# Patient Record
Sex: Female | Born: 1985 | Race: White | Hispanic: No | Marital: Single | Smoking: Never smoker
Health system: Southern US, Community
[De-identification: ages and names within clinical notes are randomized; demographics above are authoritative.]

---
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
## 2013-05-28 ENCOUNTER — Emergency Department (HOSPITAL_COMMUNITY)
Admission: EM | Admit: 2013-05-28 | Discharge: 2013-05-29 | Disposition: A | Discharge status: Home / Self Care | Payer: BC Managed Care – PPO | Attending: Emergency Medicine | Admitting: Emergency Medicine

## 2013-05-28 ENCOUNTER — Encounter (HOSPITAL_COMMUNITY): Payer: Self-pay | Admitting: Emergency Medicine

## 2013-05-28 ENCOUNTER — Emergency Department (HOSPITAL_COMMUNITY): Payer: BC Managed Care – PPO

## 2013-05-28 DIAGNOSIS — R072 Precordial pain: Secondary | ICD-10-CM | POA: Insufficient documentation

## 2013-05-28 DIAGNOSIS — R0602 Shortness of breath: Secondary | ICD-10-CM | POA: Insufficient documentation

## 2013-05-28 DIAGNOSIS — R0781 Pleurodynia: Secondary | ICD-10-CM

## 2013-05-28 DIAGNOSIS — Z79899 Other long term (current) drug therapy: Secondary | ICD-10-CM | POA: Insufficient documentation

## 2013-05-28 LAB — CBC
HCT: 39.1 % (ref 36.0–46.0)
Hemoglobin: 13.4 g/dL (ref 12.0–15.0)
MCH: 27.3 pg (ref 26.0–34.0)
MCHC: 34.3 g/dL (ref 30.0–36.0)
MCV: 79.8 fL (ref 78.0–100.0)
Platelets: 362 K/uL (ref 150–400)
RBC: 4.9 MIL/uL (ref 3.87–5.11)
RDW: 12.8 % (ref 11.5–15.5)
WBC: 6.3 K/uL (ref 4.0–10.5)

## 2013-05-28 LAB — POCT I-STAT TROPONIN I: Troponin i, poc: 0 ng/mL (ref 0.00–0.08)

## 2013-05-28 LAB — BASIC METABOLIC PANEL WITH GFR
BUN: 8 mg/dL (ref 6–23)
CO2: 24 meq/L (ref 19–32)
Calcium: 9 mg/dL (ref 8.4–10.5)
Chloride: 99 meq/L (ref 96–112)
Creatinine, Ser: 0.73 mg/dL (ref 0.50–1.10)
GFR calc Af Amer: >90 mL/min (ref >90)
GFR calc non Af Amer: >90 mL/min (ref >90)
Glucose, Bld: 127 mg/dL — ABNORMAL HIGH (ref 70–99)
Potassium: 3.7 meq/L (ref 3.5–5.1)
Sodium: 135 meq/L (ref 135–145)

## 2013-05-28 LAB — PRO B NATRIURETIC PEPTIDE: Pro B Natriuretic peptide (BNP): 9.6 pg/mL (ref 0–125)

## 2013-05-28 MED ORDER — SODIUM CHLORIDE 0.9 % IV BOLUS (SEPSIS)
500.0000 mL | Freq: Once | INTRAVENOUS | Status: AC
  Administered 2013-05-29: 500 mL via INTRAVENOUS

## 2013-05-28 MED ORDER — IBUPROFEN 800 MG PO TABS
800.0000 mg | ORAL_TABLET | Freq: Once | ORAL | Status: AC
  Administered 2013-05-29: 800 mg via ORAL
  Filled 2013-05-28: qty 1

## 2013-05-28 NOTE — ED Notes (Signed)
Pt states CP all over that started last night around midnight. Pain exacerbated by deep breaths.

## 2013-05-28 NOTE — ED Provider Notes (Signed)
CSN: [id number]     Arrival date & time 05/28/13  2131 History   First MD Initiated Contact with Patient 05/28/13 2331     Chief Complaint  Patient presents with  . Chest Pain   (Consider location/radiation/quality/duration/timing/severity/associated sxs/prior Treatment) HPI Comments: 27 yo female with DM, obesity hx presents with pleuritic cp today intermittent with coughing/ breathing, no hx of similar. Patient denies blood clot history, active cancer, recent major trauma or surgery, unilateral leg swelling/ pain, recent long travel, hemoptysis or oral contraceptives. No cardiac hx, smoking or exertional sxs.  Improves with not taking big breaths. Non radiating, right anterior, sharp.  Patient is a 27 y.o. female presenting with chest pain. The history is provided by the patient.  Chest Pain Associated symptoms: shortness of breath   Associated symptoms: no abdominal pain, no back pain, no cough, no fever, no headache and not vomiting     History reviewed. No pertinent past medical history. No past surgical history on file. No family history on file. History  Substance Use Topics  . Smoking status: Never Smoker   . Smokeless tobacco: Never Used  . Alcohol Use: No   OB History   Grav Para Term Preterm Abortions TAB SAB Ect Mult Living                 Review of Systems  Constitutional: Negative for fever and chills.  HENT: Negative for congestion.   Eyes: Negative for visual disturbance.  Respiratory: Positive for shortness of breath. Negative for cough.   Cardiovascular: Positive for chest pain.  Gastrointestinal: Negative for vomiting and abdominal pain.  Genitourinary: Negative for dysuria and flank pain.  Musculoskeletal: Negative for back pain, neck pain and neck stiffness.  Skin: Negative for rash.  Neurological: Negative for light-headedness and headaches.    Allergies  Review of patient's allergies indicates no known allergies.  Home Medications   Current  Outpatient Rx  Name  Route  Sig  Dispense  Refill  . buPROPion (WELLBUTRIN XL) 150 MG 24 hr tablet   Oral   Take 150 mg by mouth daily.         Marland Kitchen ibuprofen (ADVIL,MOTRIN) 200 MG tablet   Oral   Take 600 mg by mouth every 6 (six) hours as needed for mild pain.          . metFORMIN (GLUCOPHAGE) 500 MG tablet   Oral   Take 500 mg by mouth daily.         . pseudoephedrine (SUDAFED) 120 MG 12 hr tablet   Oral   Take 120 mg by mouth daily as needed for congestion.          BP 135/79  Pulse 110  Temp(Src) 98.9 F (37.2 C) (Oral)  Resp 16  Wt 280 lb (127.007 kg)  SpO2 96%  LMP 04/12/2013 Physical Exam  Nursing note and vitals reviewed. Constitutional: She is oriented to person, place, and time. She appears well-developed and well-nourished.  HENT:  Head: Normocephalic and atraumatic.  Eyes: Conjunctivae are normal. Right eye exhibits no discharge. Left eye exhibits no discharge.  Neck: Normal range of motion. Neck supple. No tracheal deviation present.  Cardiovascular: Normal rate and regular rhythm.   Pulmonary/Chest: Effort normal and breath sounds normal.  Abdominal: Soft. She exhibits no distension. There is no tenderness. There is no guarding.  Musculoskeletal: She exhibits no edema and no tenderness.  Neurological: She is alert and oriented to person, place, and time.  Skin: Skin is warm.  No rash noted.  Psychiatric: She has a normal mood and affect.    ED Course  Procedures (including critical care time) Labs Review Labs Reviewed  BASIC METABOLIC PANEL - Abnormal; Notable for the following:    Glucose, Bld 127 (*)    All other components within normal limits  D-DIMER, QUANTITATIVE - Abnormal; Notable for the following:    D-Dimer, Quant 3.61 (*)    All other components within normal limits  HEPATIC FUNCTION PANEL - Abnormal; Notable for the following:    Total Protein 8.4 (*)    Total Bilirubin 0.2 (*)    All other components within normal limits  CBC   PRO B NATRIURETIC PEPTIDE  POCT I-STAT TROPONIN I   Imaging Review Dg Chest 2 View  05/28/2013   CLINICAL DATA:  Chest pain and shortness of breath.  EXAM: CHEST  2 VIEW  COMPARISON:  Chest radiograph performed 11/22/2004  FINDINGS: The lungs are well-aerated and clear. There is no evidence of focal opacification, pleural effusion or pneumothorax.  The heart is borderline enlarged. No acute osseous abnormalities are seen.  IMPRESSION: No acute cardiopulmonary process seen; borderline cardiomegaly.   Electronically Signed   By: Roanna Raider M.D.   On: 05/28/2013 22:29   Ct Angio Chest Pe W/cm &/or Wo Cm  05/29/2013   CLINICAL DATA:  Chest pain.  EXAM: CT ANGIOGRAPHY CHEST WITH CONTRAST  TECHNIQUE: Multidetector CT imaging of the chest was performed using the standard protocol during bolus administration of intravenous contrast. Multiplanar CT image reconstructions including MIPs were obtained to evaluate the vascular anatomy.  CONTRAST:  50mL OMNIPAQUE IOHEXOL 350 MG/ML SOLN  COMPARISON:  Chest radiographs obtained yesterday.  FINDINGS: Normally opacified pulmonary arteries with no pulmonary arterial filling defects seen. Minimal bilateral dependent atelectasis. Otherwise, clear lungs. No lung nodules or enlarged lymph nodes. Normal appearing bones. Unremarkable upper abdomen.  Review of the MIP images confirms the above findings.  IMPRESSION: No pulmonary emboli or acute abnormality   Electronically Signed   By: Gordan Payment M.D.   On: 05/29/2013 01:59    EKG Interpretation     Ventricular Rate:  116 PR Interval:  124 QRS Duration: 80 QT Interval:  324 QTC Calculation: 450 R Axis:   52 Text Interpretation:  Sinus tachycardia Otherwise normal ECG            MDM  No diagnosis found. Low risk cardaic, Low risk PE. Tachy D dimer for further eval. Pleurisy vs PE vs other Motrin for pain.  Well appearing.  D dimer pos. Results and differential diagnosis were discussed with the  patient. Close follow up outpatient was discussed, patient comfortable with the plan.   Diagnosis: Pleuritic chest pain    Enid Skeens, MD 05/29/13 [phone]

## 2013-05-29 ENCOUNTER — Encounter (HOSPITAL_COMMUNITY): Payer: Self-pay | Admitting: Radiology

## 2013-05-29 ENCOUNTER — Emergency Department (HOSPITAL_COMMUNITY): Payer: BC Managed Care – PPO

## 2013-05-29 LAB — HEPATIC FUNCTION PANEL
ALT: 27 U/L (ref 0–35)
AST: 22 U/L (ref 0–37)
Albumin: 3.8 g/dL (ref 3.5–5.2)
Alkaline Phosphatase: 49 U/L (ref 39–117)
Bilirubin, Direct: <0.1 mg/dL (ref 0.0–0.3)
Total Protein: 8.4 g/dL — ABNORMAL HIGH (ref 6.0–8.3)

## 2013-05-29 MED ORDER — IOHEXOL 350 MG/ML SOLN
80.0000 mL | Freq: Once | INTRAVENOUS | Status: AC | PRN
  Administered 2013-05-29: 50 mL via INTRAVENOUS

## 2019-07-20 ENCOUNTER — Emergency Department: Payer: BC Managed Care – PPO

## 2019-07-20 ENCOUNTER — Other Ambulatory Visit: Payer: Self-pay

## 2019-07-20 ENCOUNTER — Encounter: Payer: Self-pay | Admitting: Emergency Medicine

## 2019-07-20 DIAGNOSIS — Z79899 Other long term (current) drug therapy: Secondary | ICD-10-CM | POA: Diagnosis not present

## 2019-07-20 DIAGNOSIS — M7752 Other enthesopathy of left foot: Secondary | ICD-10-CM | POA: Diagnosis not present

## 2019-07-20 DIAGNOSIS — Z7984 Long term (current) use of oral hypoglycemic drugs: Secondary | ICD-10-CM | POA: Diagnosis not present

## 2019-07-20 DIAGNOSIS — M25572 Pain in left ankle and joints of left foot: Secondary | ICD-10-CM | POA: Diagnosis present

## 2019-07-20 NOTE — ED Triage Notes (Signed)
Pt presents to ER from home with complaints of left foot pain, pt reports pain started about 3 weeks ago, denies any injury to foot. Pt ambulatory to triage with slow but steady gait. No distress noted

## 2019-07-21 ENCOUNTER — Emergency Department
Admission: EM | Admit: 2019-07-21 | Discharge: 2019-07-21 | Disposition: A | Discharge status: Home / Self Care | Payer: BC Managed Care – PPO | Attending: Emergency Medicine | Admitting: Emergency Medicine

## 2019-07-21 DIAGNOSIS — M7752 Other enthesopathy of left foot: Secondary | ICD-10-CM

## 2019-07-21 DIAGNOSIS — M25572 Pain in left ankle and joints of left foot: Secondary | ICD-10-CM

## 2019-07-21 MED ORDER — NAPROXEN 375 MG PO TABS: 375.0000 mg | ORAL_TABLET | Freq: Two times a day (BID) | ORAL | 0 refills | Status: AC

## 2019-07-21 NOTE — ED Provider Notes (Signed)
[facility] Emergency Department Provider Note  ____________________________________________   First MD Initiated Contact with Patient 07/21/19 [phone]     (approximate)  I have reviewed the triage vital signs and the nursing notes.   HISTORY  Chief Complaint Foot Pain (left )    HPI Shelia Freeman is a 34 y.o. female  Here with left ankle pain. Pt reports that over the past day, she's had worsening medial anle pain in her left ankle. She awoke from sleep with the pain and does not recall any preceding injury or twisting. She does note she has been working more than usual and works on her feet at [employer], though she tries to wear supportive shoes. Pain is aching, throbbing, worse w/ palpation and weightbearing. No alleviating factors. No distal numbness or weakness. No other complaints.         History reviewed. No pertinent past medical history.  There are no problems to display for this patient.   History reviewed. No pertinent surgical history.  Prior to Admission medications   Medication Sig Start Date End Date Taking? Authorizing Provider  buPROPion (WELLBUTRIN XL) 150 MG 24 hr tablet Take 150 mg by mouth daily.    [provider]  ibuprofen (ADVIL,MOTRIN) 200 MG tablet Take 600 mg by mouth every 6 (six) hours as needed for mild pain.     [provider]  metFORMIN (GLUCOPHAGE) 500 MG tablet Take 500 mg by mouth daily.    [provider]  naproxen (NAPROSYN) 375 MG tablet Take 1 tablet (375 mg total) by mouth 2 (two) times daily with a meal for 5 days. 07/21/19 07/26/19  Duffy Bruce, MD  pseudoephedrine (SUDAFED) 120 MG 12 hr tablet Take 120 mg by mouth daily as needed for congestion.    [provider]    Allergies Patient has no known allergies.  No family history on file.  Social History Social History   Tobacco Use  . Smoking status: Never Smoker  . Smokeless tobacco: Never Used  Substance Use Topics   . Alcohol use: No  . Drug use: No    Review of Systems  Review of Systems  Constitutional: Negative for chills and fever.  HENT: Negative for sore throat.   Respiratory: Negative for shortness of breath.   Cardiovascular: Negative for chest pain.  Gastrointestinal: Negative for abdominal pain.  Genitourinary: Negative for flank pain.  Musculoskeletal: Positive for arthralgias and joint swelling. Negative for neck pain.  Skin: Negative for rash and wound.  Allergic/Immunologic: Negative for immunocompromised state.  Neurological: Negative for weakness and numbness.  Hematological: Does not bruise/bleed easily.     ____________________________________________  PHYSICAL EXAM:      VITAL SIGNS: ED Triage Vitals  Enc Vitals Group     BP 07/20/19 2235 139/74     Pulse Rate 07/20/19 2235 86     Resp 07/20/19 2235 20     Temp 07/20/19 2235 98.3 F (36.8 C)     Temp Source 07/20/19 2235 Oral     SpO2 07/20/19 2235 99 %     Weight 07/20/19 2238 280 lb (127 kg)     Height 07/20/19 2238 5\' 5"  (1.651 m)     Head Circumference --      Peak Flow --      Pain Score 07/20/19 2237 6     Pain Loc --      Pain Edu? --      Excl. in [place]? --  Physical Exam Vitals and nursing note reviewed.  Constitutional:      General: She is not in acute distress.    Appearance: She is well-developed.  HENT:     Head: Normocephalic and atraumatic.  Eyes:     Conjunctiva/sclera: Conjunctivae normal.  Cardiovascular:     Rate and Rhythm: Normal rate and regular rhythm.     Heart sounds: Normal heart sounds.  Pulmonary:     Effort: Pulmonary effort is normal. No respiratory distress.     Breath sounds: No wheezing.  Abdominal:     General: There is no distension.  Musculoskeletal:     Cervical back: Neck supple.  Skin:    General: Skin is warm.     Capillary Refill: Capillary refill takes less than 2 seconds.     Findings: No rash.  Neurological:     Mental Status: She is alert and  oriented to person, place, and time.     Motor: No abnormal muscle tone.      LOWER EXTREMITY EXAM: LEFT  INSPECTION & PALPATION: TTP along posterior medial malleolus extending to medial midfoot, with no swelling. No deformity. No instability on ligamentous testing. No redness or warmth.  SENSORY: sensation is intact to light touch in:  Superficial peroneal nerve distribution (over dorsum of foot) Deep peroneal nerve distribution (over first dorsal web space) Sural nerve distribution (over lateral aspect 5th metatarsal) Saphenous nerve distribution (over medial instep)  MOTOR:  + Motor EHL (great toe dorsiflexion) + FHL (great toe plantar flexion)  + TA (ankle dorsiflexion)  + GSC (ankle plantar flexion)  VASCULAR: 2+ dorsalis pedis and posterior tibialis pulses Capillary refill < 2 sec, toes warm and well-perfused  COMPARTMENTS: Soft, warm, well-perfused No pain with passive extension No parethesias   ____________________________________________   LABS (all labs ordered are listed, but only abnormal results are displayed)  Labs Reviewed - No data to display  ____________________________________________  EKG: None ________________________________________  RADIOLOGY All imaging, including plain films, CT scans, and ultrasounds, independently reviewed by me, and interpretations confirmed via formal radiology reads.  ED MD interpretation:   Plain film ankle: No acute fx, mild soft tissue swelling along medial aspect, small calcenal spur  Official radiology report(s): DG Ankle Complete Left  Result Date: 07/20/2019 CLINICAL DATA:  Ankle pain EXAM: LEFT ANKLE COMPLETE - 3+ VIEW COMPARISON:  None. FINDINGS: No fracture or malalignment. Ankle mortise is symmetric. There is soft tissue swelling. Small plantar calcaneal spur IMPRESSION: No acute osseous abnormality. Electronically Signed   By: Jasmine Pang M.D.   On: 07/20/2019 23:09     ____________________________________________  PROCEDURES   Procedure(s) performed (including Critical Care):  Procedures  ____________________________________________  INITIAL IMPRESSION / MDM / ASSESSMENT AND PLAN / ED COURSE  As part of my medical decision making, I reviewed the following data within the electronic MEDICAL RECORD NUMBER Nursing notes reviewed and incorporated, Old chart reviewed, Notes from prior ED visits, and [place] Controlled Substance Database       *Shelia Freeman was evaluated in Emergency Department on 07/21/2019 for the symptoms described in the history of present illness. She was evaluated in the context of the global COVID-19 pandemic, which necessitated consideration that the patient might be at risk for infection with the SARS-CoV-2 virus that causes COVID-19. Institutional protocols and algorithms that pertain to the evaluation of patients at risk for COVID-19 are in a state of rapid change based on information released by regulatory bodies including the CDC and federal and  state organizations. These policies and algorithms were followed during the patient's care in the ED.  Some ED evaluations and interventions may be delayed as a result of limited staffing during the pandemic.*     Medical Decision Making:  34 yo F here with atraumatic mild L medial ankle pain, likely mild tendonitis 2/2 increased weightbearing at work. Will place in ASO, advise supportive shoes and support. No signs of bony injury or lesion. No warmth or signs of infection. Distal NVI.  ____________________________________________  FINAL CLINICAL IMPRESSION(S) / ED DIAGNOSES  Final diagnoses:  Left ankle tendonitis  Acute left ankle pain     MEDICATIONS GIVEN DURING THIS VISIT:  Medications - No data to display   ED Discharge Orders         Ordered    naproxen (NAPROSYN) 375 MG tablet  2 times daily with meals     07/21/19 0123           Note:  This document was prepared  using Dragon voice recognition software and may include unintentional dictation errors.   Shaune Pollack, MD 07/21/19 [phone]

## 2019-07-21 NOTE — Discharge Instructions (Addendum)
Wear the ASO ankle brace for the next week to help support your ankle.  I'd recommend buying a heel/arch support to wear, especially when working  Keep your leg elevated when resting and off your feet

## 2019-07-21 NOTE — ED Notes (Signed)
MD Isaacs at bedside  

## 2020-08-31 IMAGING — CR DG ANKLE COMPLETE 3+V*L*
3 series · 3 of 3 positions shown · non-contrast
Comparison: None.

CLINICAL DATA: Ankle pain

EXAM:
LEFT ANKLE COMPLETE - 3+ VIEW

[ankle ap]
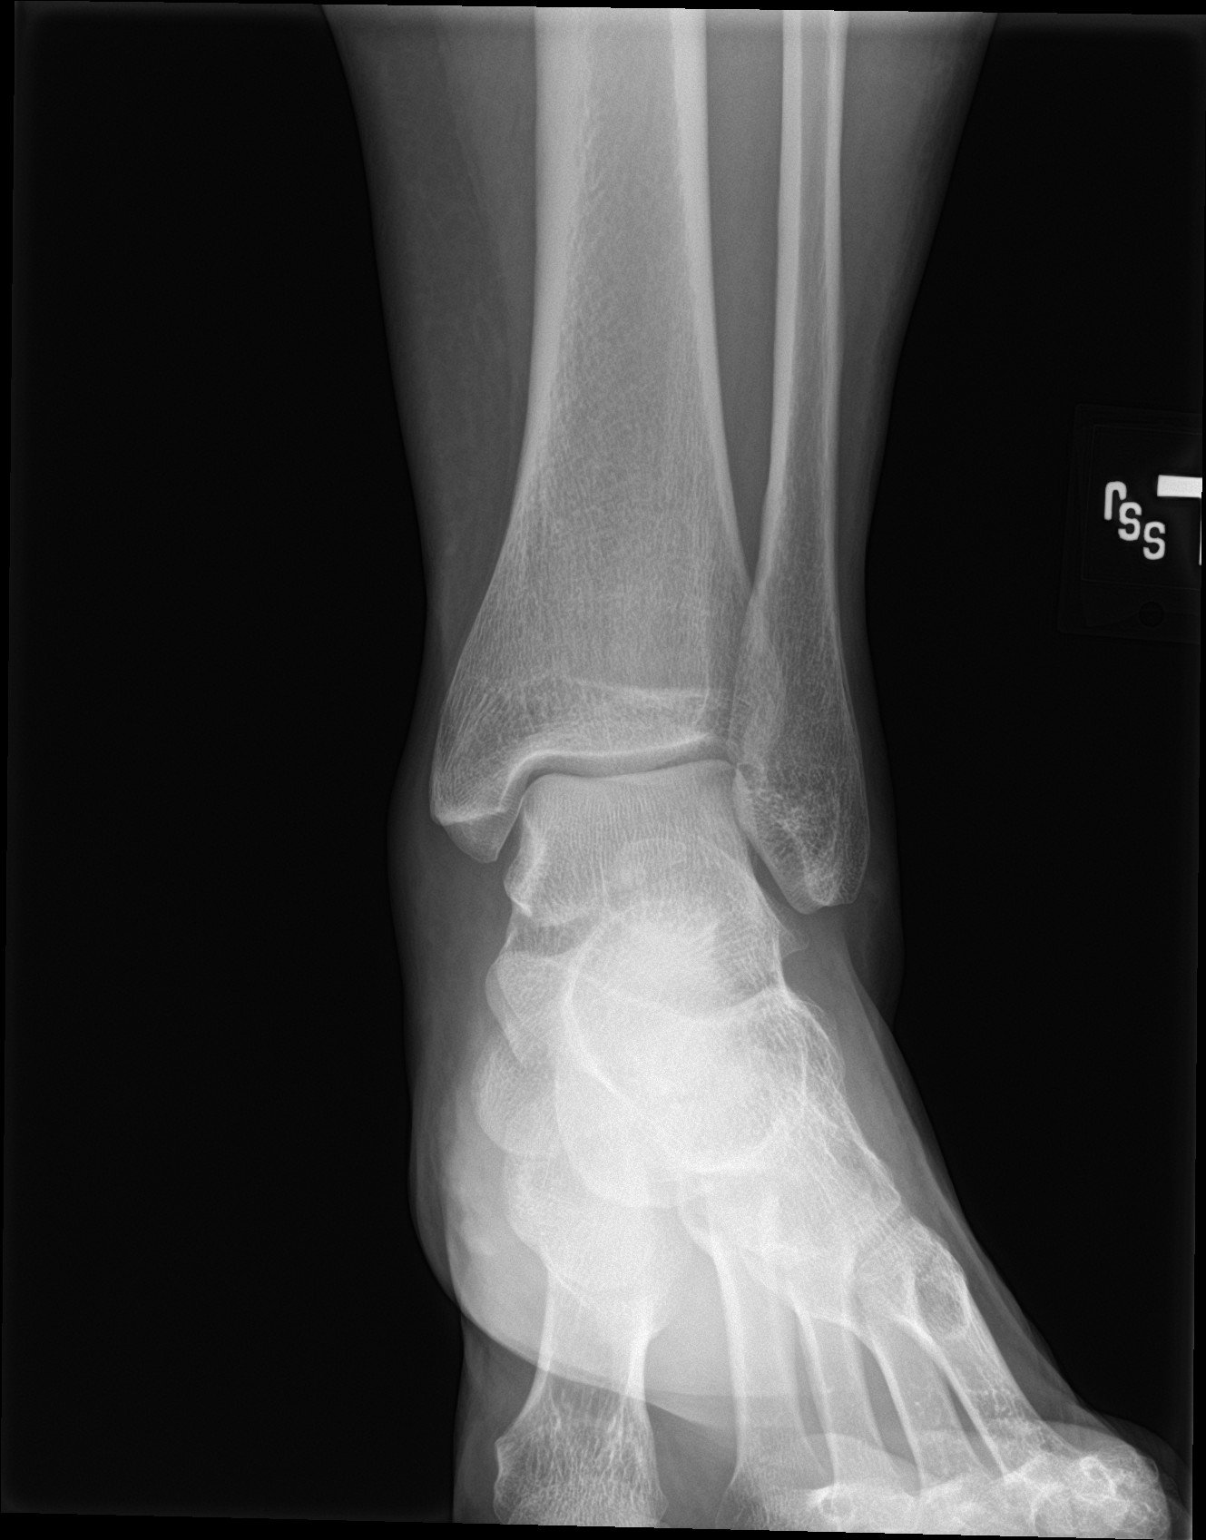

[ankle obl]
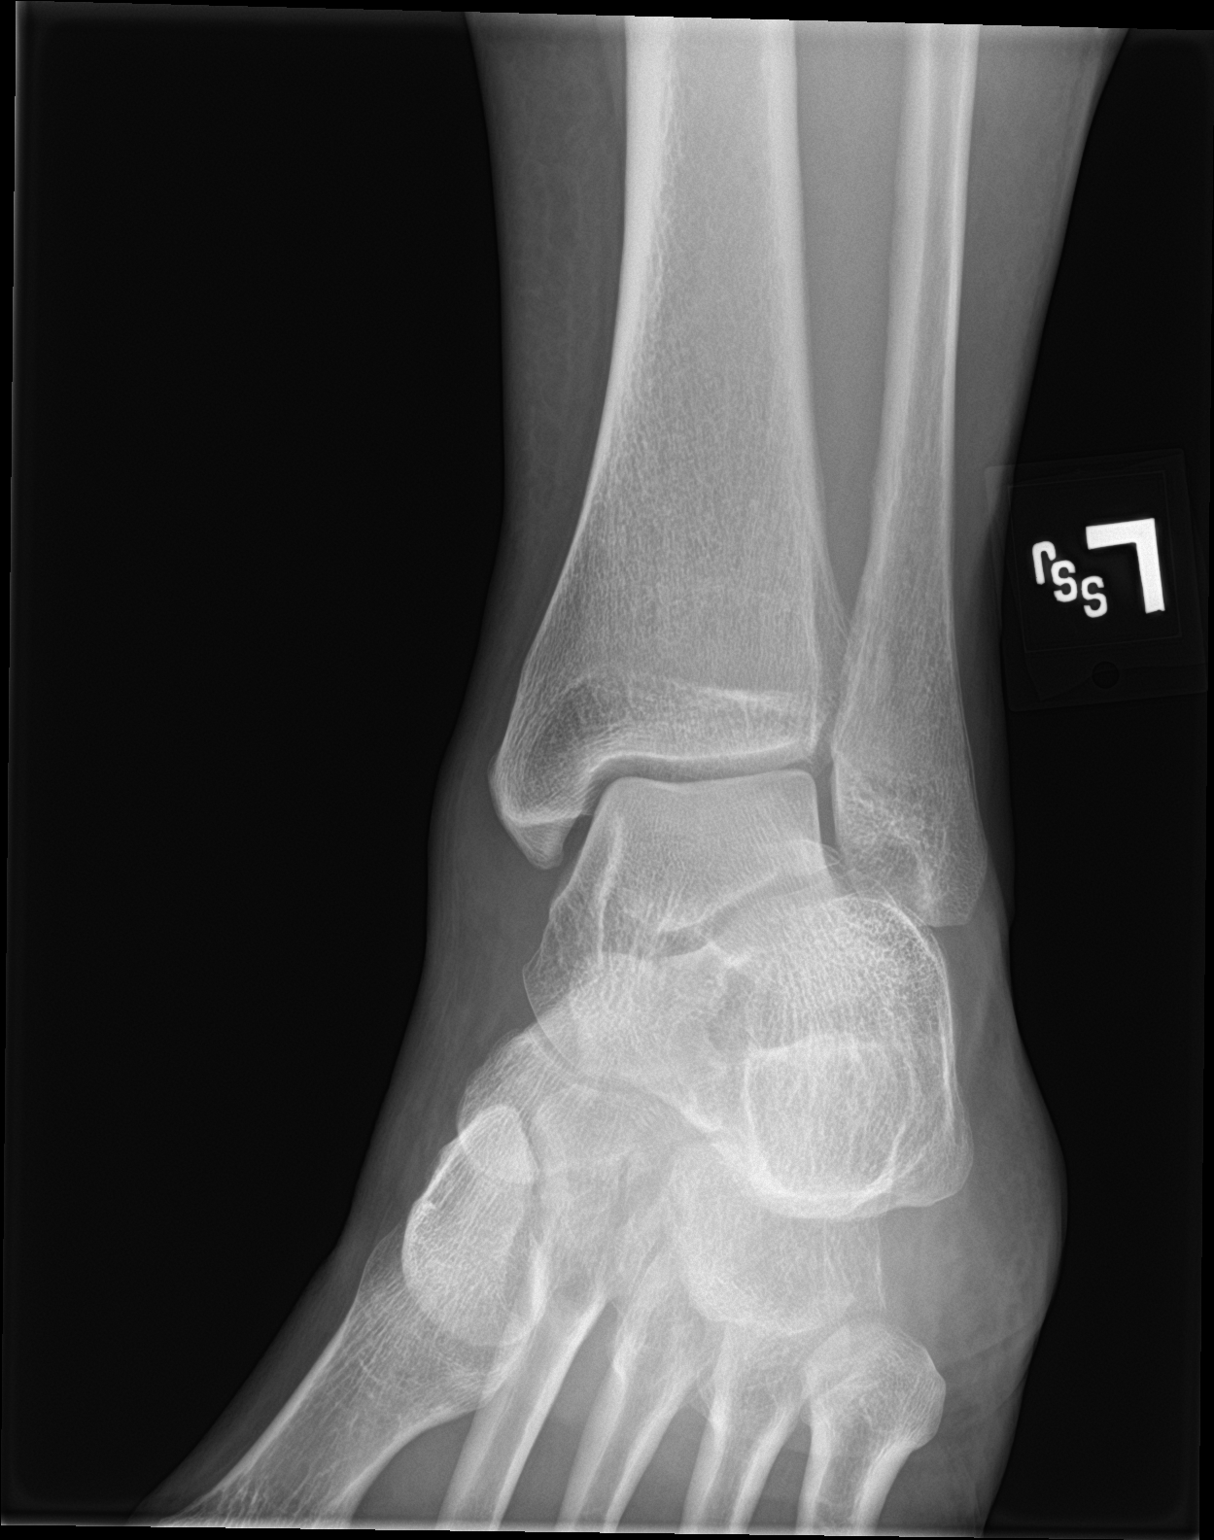

[ankle lat]
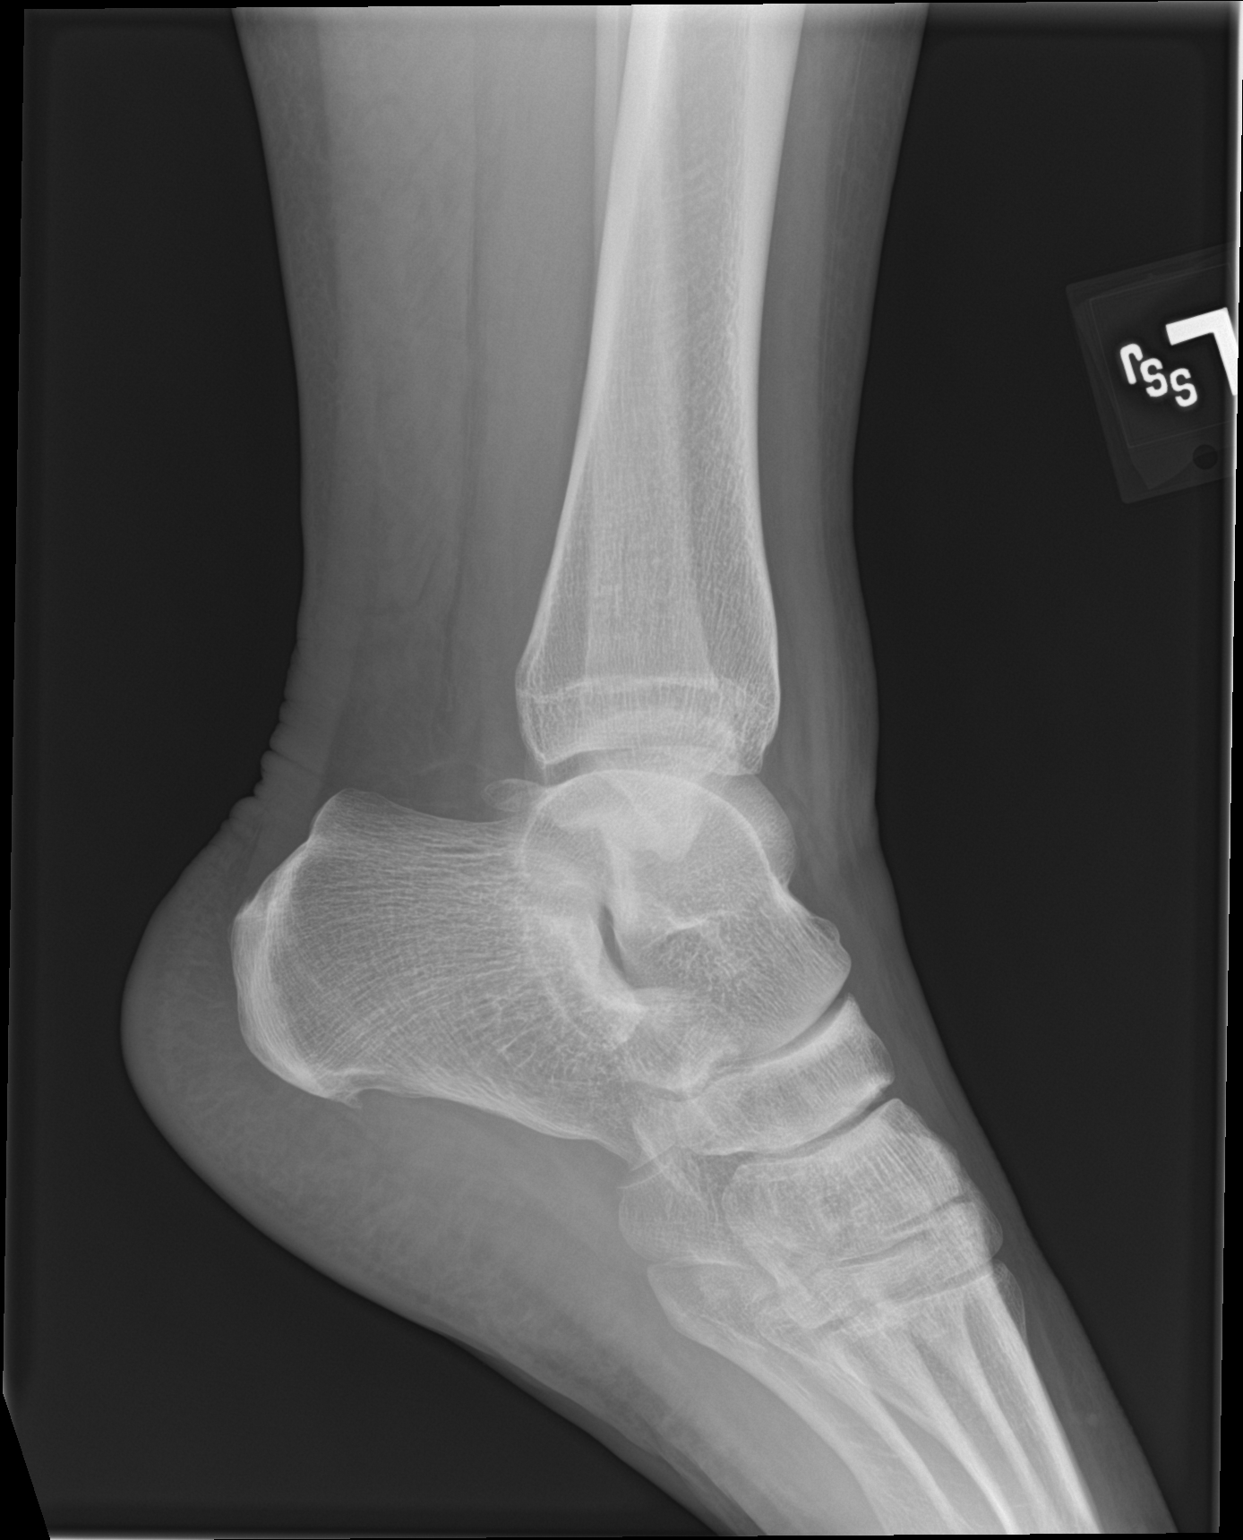

[3 of 3 positions shown; findings below may reference images not displayed]

FINDINGS: No fracture or malalignment. Ankle mortise is symmetric. There is
soft tissue swelling. Small plantar calcaneal spur
IMPRESSION: No acute osseous abnormality.

## 2022-01-22 ENCOUNTER — Encounter (HOSPITAL_COMMUNITY): Payer: Self-pay

## 2022-01-22 ENCOUNTER — Ambulatory Visit (HOSPITAL_COMMUNITY)
Admission: EM | Admit: 2022-01-22 | Discharge: 2022-01-22 | Disposition: A | Discharge status: Home / Self Care | Payer: BC Managed Care – PPO | Attending: Emergency Medicine | Admitting: Emergency Medicine

## 2022-01-22 DIAGNOSIS — R300 Dysuria: Secondary | ICD-10-CM | POA: Diagnosis present

## 2022-01-22 LAB — POCT URINALYSIS DIPSTICK, ED / UC
Bilirubin Urine: NEGATIVE
Glucose, UA: NEGATIVE mg/dL
Hgb urine dipstick: NEGATIVE
Ketones, ur: NEGATIVE mg/dL
Nitrite: NEGATIVE
Protein, ur: NEGATIVE mg/dL
Specific Gravity, Urine: 1.025 (ref 1.005–1.030)
Urobilinogen, UA: 0.2 mg/dL (ref 0.0–1.0)
pH: 5.5 (ref 5.0–8.0)

## 2022-01-22 MED ORDER — NITROFURANTOIN MONOHYD MACRO 100 MG PO CAPS: 100.0000 mg | ORAL_CAPSULE | Freq: Two times a day (BID) | ORAL | 0 refills | Status: AC

## 2022-01-22 NOTE — ED Provider Notes (Signed)
[facility]    CSN: [id number] Arrival date & time: 01/22/22  1520      History   Chief Complaint Chief Complaint  Patient presents with   Dysuria    HPI Shelia Freeman is a 36 y.o. female.   Patient presents with dysuria, urinary frequency and dribbling for 3 to 4 days.  Has not attempted treatment of symptoms.  Denies hematuria, lower abdominal pain or pressure, flank pain, fever, chills, new rash or lesions, vaginal discharge, itching or odor.  Sexually active, no concern for STDs today.    No past medical history on file.  There are no problems to display for this patient.   No past surgical history on file.  OB History   No obstetric history on file.      Home Medications    Prior to Admission medications   Medication Sig Start Date End Date Taking? Authorizing Provider  buPROPion (WELLBUTRIN XL) 150 MG 24 hr tablet Take 150 mg by mouth daily.    [provider]  ibuprofen (ADVIL,MOTRIN) 200 MG tablet Take 600 mg by mouth every 6 (six) hours as needed for mild pain.     [provider]  metFORMIN (GLUCOPHAGE) 500 MG tablet Take 500 mg by mouth daily.    [provider]  pseudoephedrine (SUDAFED) 120 MG 12 hr tablet Take 120 mg by mouth daily as needed for congestion.    [provider]    Family History No family history on file.  Social History Social History   Tobacco Use   Smoking status: Never   Smokeless tobacco: Never  Substance Use Topics   Alcohol use: No   Drug use: No     Allergies   Patient has no known allergies.   Review of Systems Review of Systems  Constitutional: Negative.   Respiratory: Negative.    Cardiovascular: Negative.   Genitourinary:  Positive for dysuria. Negative for decreased urine volume, difficulty urinating, dyspareunia, enuresis, flank pain, frequency, genital sores, hematuria, menstrual problem, pelvic pain, urgency, vaginal bleeding, vaginal discharge and vaginal  pain.     Physical Exam Triage Vital Signs ED Triage Vitals  Enc Vitals Group     BP 01/22/22 1553 126/80     Pulse Rate 01/22/22 1553 77     Resp 01/22/22 1553 16     Temp 01/22/22 1553 98 F (36.7 C)     Temp Source 01/22/22 1553 Oral     SpO2 01/22/22 1553 97 %     Weight 01/22/22 1551 296 lb (134.3 kg)     Height 01/22/22 1551 5\' 5"  (1.651 m)     Head Circumference --      Peak Flow --      Pain Score 01/22/22 1551 4     Pain Loc --      Pain Edu? --      Excl. in [place]? --    No data found.  Updated Vital Signs BP 126/80 (BP Location: Left Arm)   Pulse 77   Temp 98 F (36.7 C) (Oral)   Resp 16   Ht 5\' 5"  (1.651 m)   Wt 296 lb (134.3 kg)   LMP 12/26/2021 (Exact Date)   SpO2 97%   BMI 49.26 kg/m   Visual Acuity Right Eye Distance:   Left Eye Distance:   Bilateral Distance:    Right Eye Near:   Left Eye Near:    Bilateral Near:     Physical Exam Constitutional:  Appearance: Normal appearance.  HENT:     Head: Normocephalic.  Eyes:     Extraocular Movements: Extraocular movements intact.  Pulmonary:     Effort: Pulmonary effort is normal.  Abdominal:     General: Abdomen is flat. Bowel sounds are normal. There is no distension.     Palpations: Abdomen is soft.     Tenderness: There is abdominal tenderness in the suprapubic area. There is no right CVA tenderness or left CVA tenderness.  Genitourinary:    Comments: Deferred Skin:    General: Skin is warm and dry.  Neurological:     Mental Status: She is alert and oriented to person, place, and time. Mental status is at baseline.  Psychiatric:        Mood and Affect: Mood normal.        Behavior: Behavior normal.      UC Treatments / Results  Labs (all labs ordered are listed, but only abnormal results are displayed) Labs Reviewed  POCT URINALYSIS DIPSTICK, ED / UC - Abnormal; Notable for the following components:      Result Value   Leukocytes,Ua SMALL (*)    All other components within  normal limits    EKG   Radiology No results found.  Procedures Procedures (including critical care time)  Medications Ordered in UC Medications - No data to display  Initial Impression / Assessment and Plan / UC Course  I have reviewed the triage vital signs and the nursing notes.  Pertinent labs & imaging results that were available during my care of the patient were reviewed by me and considered in my medical decision making (see chart for details).  Dysuria  Vital signs are stable, patient is in no signs of distress, mild tenderness noted to the suprapubic region, low suspicion for an acute abdomen, urinalysis showing Shelia Freeman blood cells only, sent for culture, STI labs are pending, will treat per protocol, as patient is symptomatic we will begin bacterial coverage, Macrobid 5-day course sent to pharmacy, may use Tylenol, ibuprofen and Pyridium for management of discomfort, may follow-up with his urgent care as needed if symptoms persist or worsen Final Clinical Impressions(s) / UC Diagnoses   Final diagnoses:  None   Discharge Instructions   None    ED Prescriptions   None    PDMP not reviewed this encounter.   Valinda Hoar, [place] 01/22/22 [phone]

## 2022-01-22 NOTE — ED Triage Notes (Signed)
Onset of Symptoms Thursday/Friday. Patient having painful urination, urinary leaking, burning with urination.   No low abdominal/back pain, no blood, no odor.

## 2022-01-22 NOTE — Discharge Instructions (Signed)
Your urinalysis shows Shelia Freeman blood cells but did not show signs of bacteria your urine will be sent to the lab to determine if bacteria is present, if any changes need to be made to your medications you will be notified  You have completed a vaginal swab to check for bacterial vaginosis and yeast to rule out possible causes of symptoms, you will be notified of any positive test results and treatment will be sent in at time of notification  Begin use of Macrobid every morning and every evening for 5 days  You may use over-the-counter Pyridium to help minimize your symptoms until antibiotic removes bacteria, this medication will turn your urine orange  Increase your fluid intake through use of water  As always practice good hygiene, wiping front to back and avoidance of scented vaginal products to prevent further irritation  If symptoms continue to persist after use of medication or recur please follow-up with urgent care or your primary doctor as needed

## 2022-01-23 LAB — URINE CULTURE: Culture: 50000 — AB

## 2022-01-23 LAB — CERVICOVAGINAL ANCILLARY ONLY
Bacterial Vaginitis (gardnerella): NEGATIVE
Candida Glabrata: NEGATIVE
Candida Vaginitis: NEGATIVE
Chlamydia: NEGATIVE
Comment: NEGATIVE
Comment: NEGATIVE
Comment: NEGATIVE
Comment: NEGATIVE
Comment: NEGATIVE
Comment: NORMAL
Neisseria Gonorrhea: NEGATIVE
Trichomonas: POSITIVE — AB

## 2022-01-24 ENCOUNTER — Telehealth (HOSPITAL_COMMUNITY): Payer: Self-pay | Admitting: Emergency Medicine

## 2022-01-24 MED ORDER — METRONIDAZOLE 500 MG PO TABS: 500.0000 mg | ORAL_TABLET | Freq: Two times a day (BID) | ORAL | 0 refills | Status: AC
